# Patient Record
Sex: Male | Born: 1970 | Race: Black or African American | Hispanic: No | Marital: Married | Smoking: Never smoker
Health system: Southern US, Community
[De-identification: ages and names within clinical notes are randomized; demographics above are authoritative.]

## PROBLEM LIST (undated history)

## (undated) HISTORY — PX: HAND SURGERY: SHX662

---

## 2000-08-31 ENCOUNTER — Emergency Department (HOSPITAL_COMMUNITY): Admission: EM | Admit: 2000-08-31 | Discharge: 2000-08-31 | Payer: Self-pay | Admitting: Internal Medicine

## 2000-08-31 ENCOUNTER — Encounter: Payer: Self-pay | Admitting: Internal Medicine

## 2009-01-05 ENCOUNTER — Observation Stay (HOSPITAL_COMMUNITY): Admission: EM | Admit: 2009-01-05 | Discharge: 2009-01-06 | Payer: Self-pay | Admitting: Emergency Medicine

## 2009-01-05 ENCOUNTER — Encounter (INDEPENDENT_AMBULATORY_CARE_PROVIDER_SITE_OTHER): Payer: Self-pay | Admitting: General Surgery

## 2010-06-19 IMAGING — CT CT ABDOMEN W/ CM
2 of 4 series · 17 of 46 positions shown, 19 images · IV contrast (APPLIED)
Comparison: None available.

CT ABDOMEN

CLINICAL DATA: Right lower quadrant abdominal pain times 3 days.
Vomiting.

CT ABDOMEN AND PELVIS WITH CONTRAST
TECHNIQUE: Multidetector CT imaging of the abdomen and pelvis was
performed using the standard protocol following bolus
administration of intravenous contrast.
Contrast: 100 ml Omnipaque 300

[Series 3: abd/pelv with 5.0 b31f st · axial · 0.64mm/px · z∈[+774,+1174]mm · 14 of 88 slices shown, 16 images]
[im 4/88  soft-tissue]
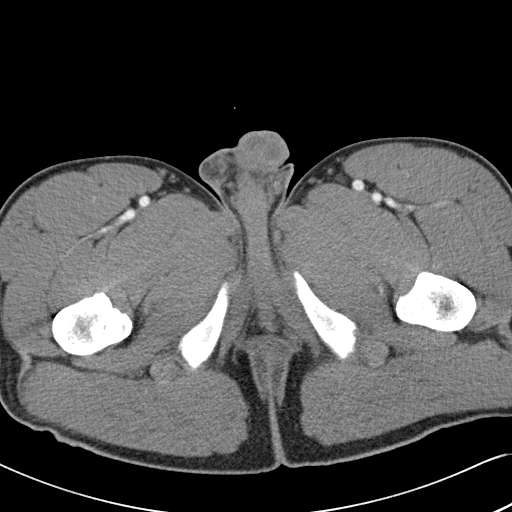
[im 4/88  bone]
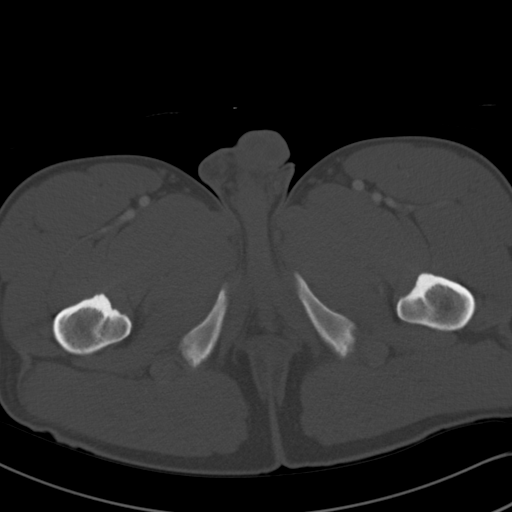
[im 12/88  soft-tissue]
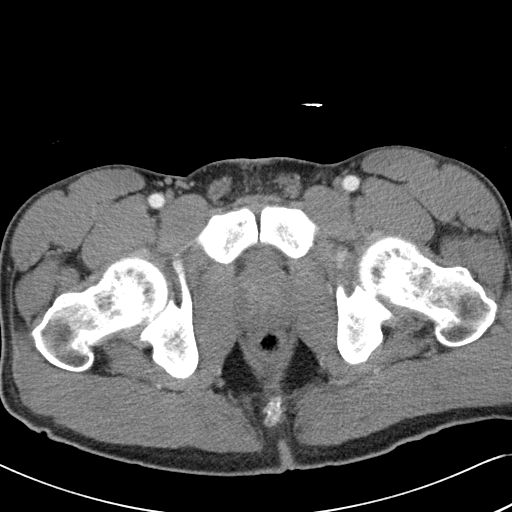
[im 16/88  soft-tissue]
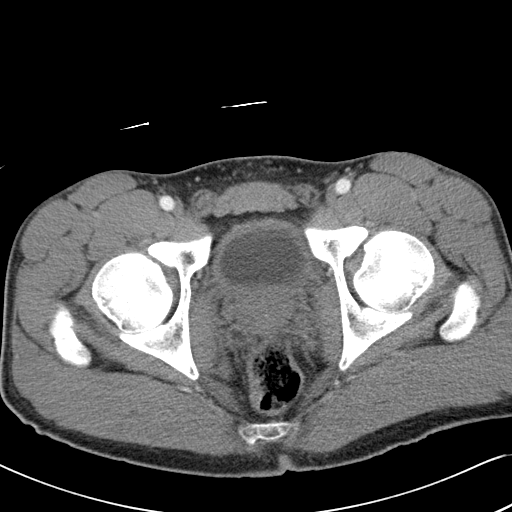
[im 24/88  soft-tissue]
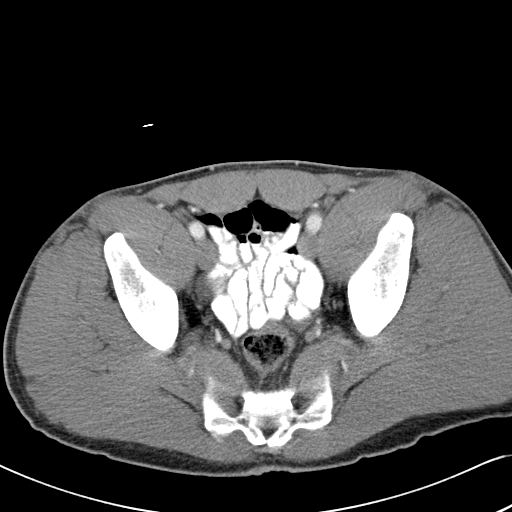
[im 28/88  soft-tissue]
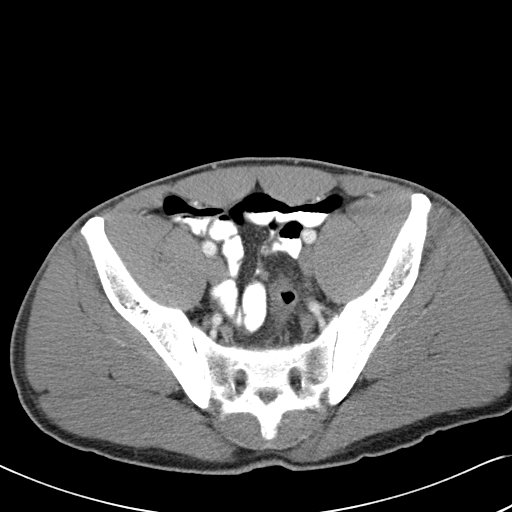
[im 36/88  soft-tissue]
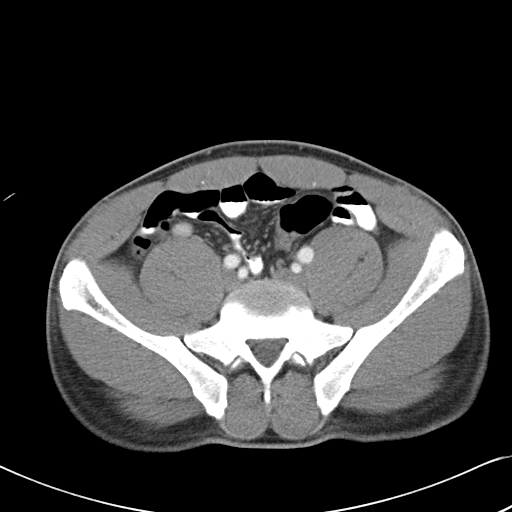
[im 40/88  soft-tissue]
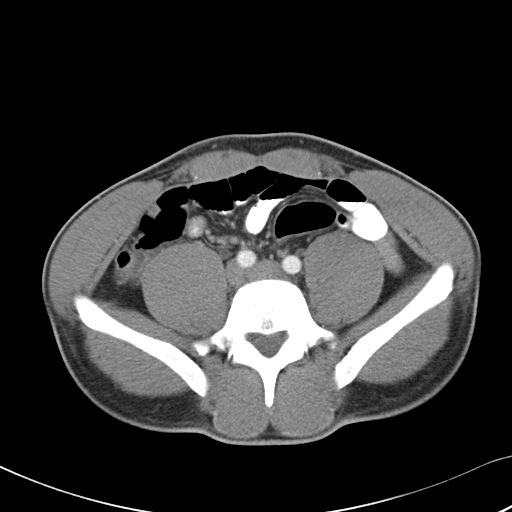
[im 48/88  soft-tissue]
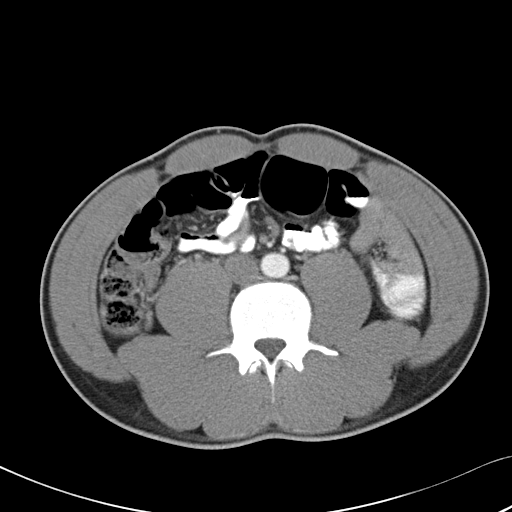
[im 52/88  soft-tissue]
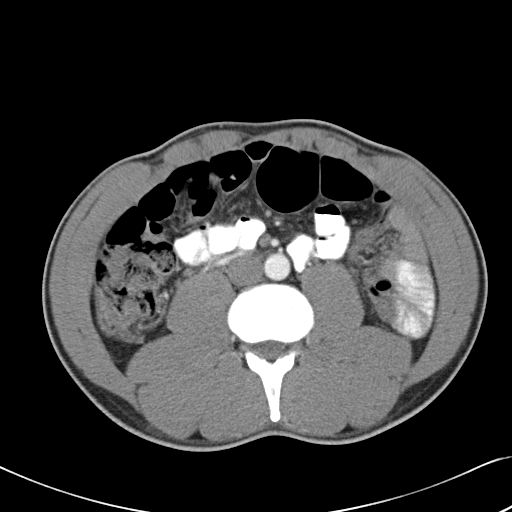
[im 52/88  bone]
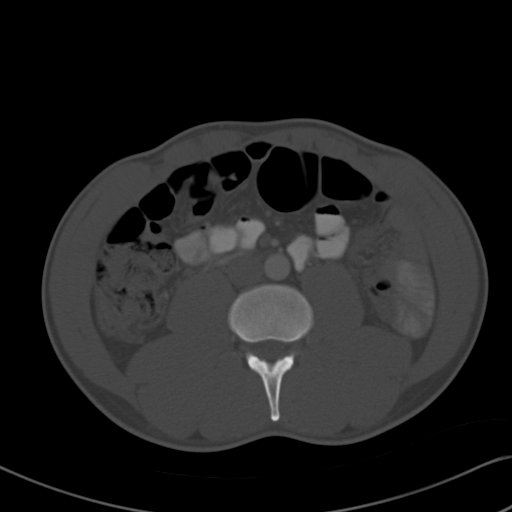
[im 60/88  soft-tissue]
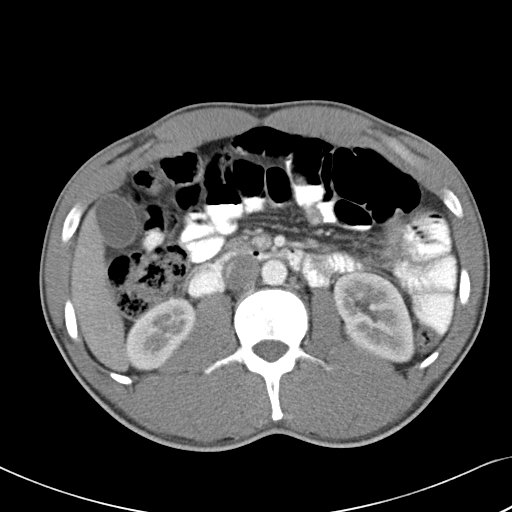
[im 64/88  soft-tissue]
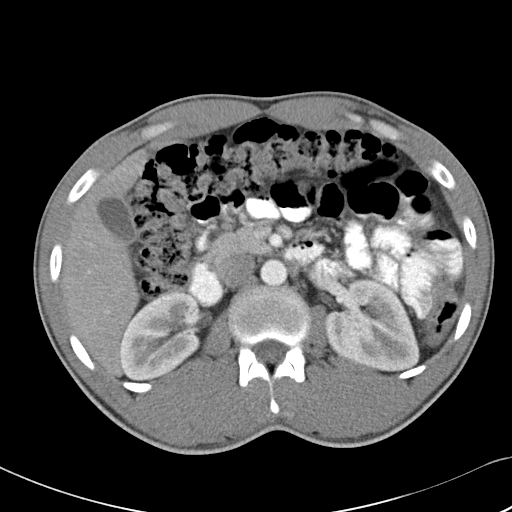
[im 72/88  soft-tissue]
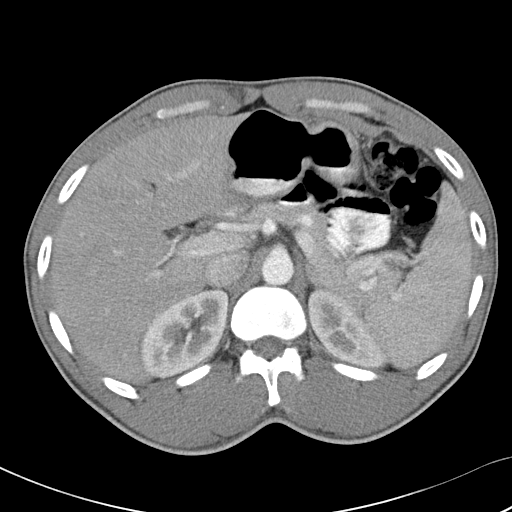
[im 76/88  soft-tissue]
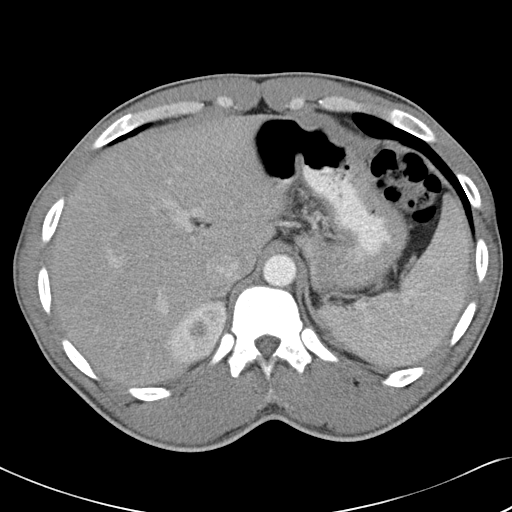
[im 84/88  soft-tissue]
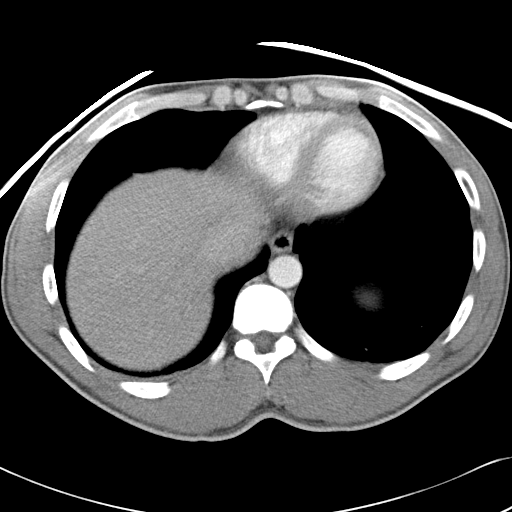

[Series 602: <mpr thick range> · coronal · 0.86mm/px · 3 of 69 slices shown]
[im 23/69  soft-tissue]
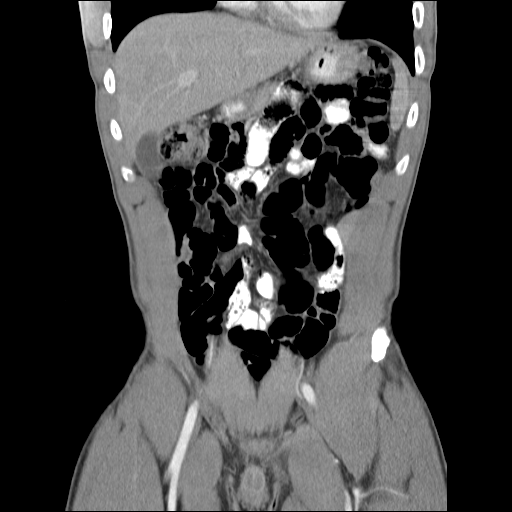
[im 31/69  soft-tissue]
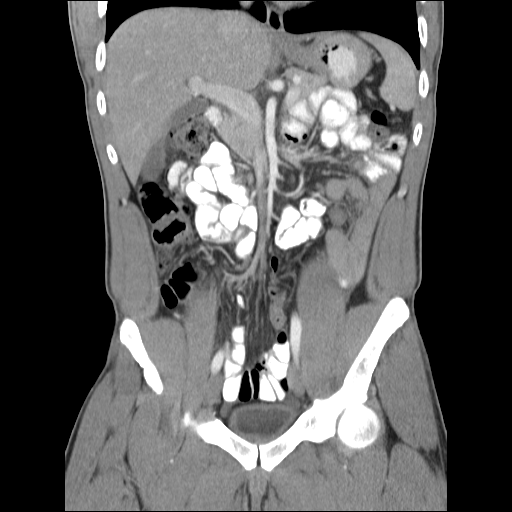
[im 38/69  soft-tissue]
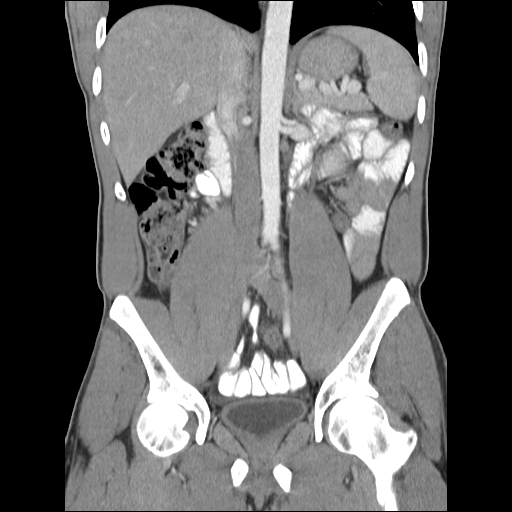

[17 of 46 positions shown; findings below may reference images not displayed]

FINDINGS: The lung bases are clear without focal nodule, mass, or
airspace disease.  The heart size is normal.  There is no
significant pleural or pericardial effusion.

The liver and spleen are within normal limits.  The stomach,
pancreas, common bile duct and gallbladder are normal.  The adrenal
glands and kidneys are within normal limits bilaterally.  There is
no significant abdominal lymphadenopathy or free fluid.  The bowel
is unremarkable.  Bone windows reveal no focal lytic or blastic
lesions.
IMPRESSION: 1.  No acute abnormality of the abdomen.

CT PELVIS
FINDINGS: The rectosigmoid colon is within normal limits.  The
remainder the colon is unremarkable.  The appendix is mildly
enlarged at its base, measuring 8 mm.  There is slight stranding of
the fat surrounding the appendix.  There is minimal free fluid
layering in the anatomic pelvis.  Urinary bladder is partially
collapsed.  Bone windows are unremarkable.
IMPRESSION: 1.  Mild enlargement and fat stranding about the appendix
suggesting early appendicitis.
2.  Minimal free fluid layering in the pelvis compatible with an
inflammatory process.

## 2011-01-07 LAB — URINALYSIS, ROUTINE W REFLEX MICROSCOPIC
Bilirubin Urine: NEGATIVE
Glucose, UA: NEGATIVE mg/dL
Hgb urine dipstick: NEGATIVE
Ketones, ur: NEGATIVE mg/dL
pH: 6 (ref 5.0–8.0)

## 2011-01-07 LAB — COMPREHENSIVE METABOLIC PANEL
ALT: 18 U/L (ref 0–53)
AST: 22 U/L (ref 0–37)
Albumin: 4.5 g/dL (ref 3.5–5.2)
Alkaline Phosphatase: 69 U/L (ref 39–117)
BUN: 9 mg/dL (ref 6–23)
CO2: 28 mEq/L (ref 19–32)
Calcium: 9.4 mg/dL (ref 8.4–10.5)
Chloride: 97 mEq/L (ref 96–112)
Creatinine, Ser: 1.07 mg/dL (ref 0.4–1.5)
GFR calc Af Amer: >60 mL/min (ref >60)
GFR calc non Af Amer: >60 mL/min (ref >60)
Glucose, Bld: 91 mg/dL (ref 70–99)
Potassium: 3.4 mEq/L — ABNORMAL LOW (ref 3.5–5.1)
Sodium: 138 mEq/L (ref 135–145)
Total Bilirubin: 1.5 mg/dL — ABNORMAL HIGH (ref 0.3–1.2)
Total Protein: 7.5 g/dL (ref 6.0–8.3)

## 2011-01-07 LAB — DIFFERENTIAL
Basophils Absolute: 0 10*3/uL (ref 0.0–0.1)
Basophils Relative: 1 % (ref 0–1)
Eosinophils Absolute: 0.1 10*3/uL (ref 0.0–0.7)
Eosinophils Relative: 2 % (ref 0–5)
Lymphocytes Relative: 23 % (ref 12–46)
Lymphs Abs: 1.4 10*3/uL (ref 0.7–4.0)
Monocytes Absolute: 0.4 10*3/uL (ref 0.1–1.0)
Monocytes Relative: 6 % (ref 3–12)
Neutro Abs: 4.1 10*3/uL (ref 1.7–7.7)
Neutrophils Relative %: 69 % (ref 43–77)

## 2011-01-07 LAB — CBC
HCT: 47.8 % (ref 39.0–52.0)
Hemoglobin: 16 g/dL (ref 13.0–17.0)
MCHC: 33.5 g/dL (ref 30.0–36.0)
MCV: 84.8 fL (ref 78.0–100.0)
Platelets: 169 10*3/uL (ref 150–400)
RBC: 5.63 MIL/uL (ref 4.22–5.81)
RDW: 13 % (ref 11.5–15.5)
WBC: 6 10*3/uL (ref 4.0–10.5)

## 2011-01-07 LAB — URINE MICROSCOPIC-ADD ON

## 2011-02-10 NOTE — Op Note (Signed)
[name], [name]               ACCOUNT NO.:  [id number]   MEDICAL RECORD NO.:  [id number]          PATIENT TYPE:  INP   LOCATION:  5125                         FACILITY:  [facility]   PHYSICIAN:  Gabrielle Dare. Janee Morn, M.D.DATE OF BIRTH:  Aug 24, 1971   DATE OF PROCEDURE:  01/05/2009  DATE OF DISCHARGE:                               OPERATIVE REPORT   PREOPERATIVE DIAGNOSIS:  Acute appendicitis.   POSTOPERATIVE DIAGNOSIS:  Acute appendicitis.   PROCEDURE:  Laparoscopic appendectomy.   SURGEON:  Gabrielle Dare. Janee Morn, MD   ANESTHESIA:  General endotracheal.   HISTORY OF PRESENT ILLNESS:  [name] [name] is a 40 year old gentleman who  was originally from [place] in [place], who came to the [facility]  Emergency Department earlier tonight with a 2-day history of right lower  quadrant abdominal pain.  Workup including CT scan of the abdomen and  pelvis was consistent with acute appendicitis.  He was brought  emergently to the operating room.   PROCEDURE IN DETAIL:  Informed consent was obtained.  The patient  received intravenous antibiotics.  He was brought to the operating room  after identifying him in the preop holding area.  He received general  endotracheal anesthesia by the anesthesia staff.  His abdomen was  prepped and draped in sterile fashion after a Foley catheter was placed  by the nursing staff.  Time-out procedure was done.  Infraumbilical area  was infiltrated with 0.25% Marcaine with epinephrine.  Infraumbilical  incision was made.  Subcutaneous tissues were dissected down revealing  the anterior fascia.  This was divided sharply along the midline.  The  peritoneal cavity was entered under direct vision without difficulty.  A  0-Vicryl purse-string suture was placed around the fascial opening and  the Hasson trocar was inserted into the abdomen.  The abdomen was  insufflated with carbon dioxide in a standard fashion.  Under direct  vision, a 12-mm left lower quadrant port and  a 5-mm right upper quadrant  port were placed.  Marcaine 0.25% with epinephrine was used at all port  sites.  Laparoscopic exploration revealed a very inflamed, but not  ruptured appendix and a very small amount of pelvic free fluid that was  clear.  The appendix was retracted towards the anterior abdominal wall.  The mesoappendix was then divided gradually with the harmonic scalpel  achieving excellent hemostasis.  Once this totally freed up the base,  the base was divided with a laparoscopic GIA stapler with vascular load  achieving an excellent closure.  The appendix was placed in an EndoCatch  bag and removed from the left lower quadrant port site.  The abdomen was  then copiously irrigated with 2 L of warm saline.  The irrigation fluid  returned clear.  The staple line on the cecum remained intact.  There  was no bleeding from the mesoappendix.  Irrigation fluid returned clear  and the remainder of that evacuated out.  The staple line was rechecked  again and remained intact.  Ports were then removed under direct vision  and pneumoperitoneum was released.  The Hasson trocar was removed.  All  three wounds were copiously irrigated.  The skin of each was then closed  with a running 4-0 Vicryl subcuticular  stitch followed by Dermabond.  Sponge, needle, and instrument counts  were all correct.  There was excellent hemostasis at the end of the  case.  The patient tolerated the procedure well without apparent  complication and was taken to the recovery room in stable condition.      Gabrielle Dare Janee Morn, M.D.  Electronically Signed     BET/MEDQ  D:  01/05/2009  T:  01/06/2009  Job:  [id number]
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
## 2012-03-01 ENCOUNTER — Encounter (HOSPITAL_COMMUNITY): Payer: Self-pay | Admitting: Emergency Medicine

## 2012-03-01 ENCOUNTER — Emergency Department (HOSPITAL_COMMUNITY)
Admission: EM | Admit: 2012-03-01 | Discharge: 2012-03-01 | Disposition: A | Discharge status: Home / Self Care | Payer: PRIVATE HEALTH INSURANCE | Source: Home / Self Care | Attending: Emergency Medicine | Admitting: Emergency Medicine

## 2012-03-01 DIAGNOSIS — H11439 Conjunctival hyperemia, unspecified eye: Secondary | ICD-10-CM

## 2012-03-01 DIAGNOSIS — H11432 Conjunctival hyperemia, left eye: Secondary | ICD-10-CM

## 2012-03-01 MED ORDER — TOBRAMYCIN 0.3 % OP OINT: TOPICAL_OINTMENT | Freq: Three times a day (TID) | OPHTHALMIC | Status: AC

## 2012-03-01 MED ORDER — TETRACAINE HCL 0.5 % OP SOLN
OPHTHALMIC | Status: AC
  Filled 2012-03-01: qty 2

## 2012-03-01 NOTE — ED Notes (Signed)
Eye box in treatment room 

## 2012-03-01 NOTE — ED Notes (Addendum)
Left eye is itchy, watery, painful.  Onset 3 days ago, no known injury.  Denies vision changes.  Does not use eye glasses or contacts.

## 2012-03-01 NOTE — Discharge Instructions (Signed)
As discussed, discomfort and tearing should be better in 48 hours episode of the antibiotic. If no improvement should follow-up with the eye doctor. No foreign body were seen in your eye.

## 2012-03-01 NOTE — ED Provider Notes (Signed)
History     CSN: [id number]  Arrival date & time 03/01/12  1328   First MD Initiated Contact with Patient 03/01/12 1349      Chief Complaint  Patient presents with  . Eye Pain    (Consider location/radiation/quality/duration/timing/severity/associated sxs/prior treatment) HPI Comments: Like itching and tearing, feels like something is in my upper eyelid but I did not see anything. Been tearing and itching in my left eye. Patient denies any injury or foreign body. Denies any headache, visual changes or nausea. No further symptoms. Patient denies any other symptoms such as sneezing, runny nose or cough  Patient is a 41 y.o. male presenting with eye pain. The history is provided by the patient.  Eye Pain This is a new problem. The current episode started more than 2 days ago. The problem occurs constantly. The problem has not changed since onset.Pertinent negatives include no headaches.    History reviewed. No pertinent past medical history.  History reviewed. No pertinent past surgical history.  No family history on file.  History  Substance Use Topics  . Smoking status: Never Smoker   . Smokeless tobacco: Not on file  . Alcohol Use: No      Review of Systems  Constitutional: Negative for activity change and appetite change.  Eyes: Positive for pain and itching. Negative for photophobia, redness and visual disturbance.  Gastrointestinal: Negative for nausea and vomiting.  Skin: Negative for rash.  Neurological: Negative for dizziness, facial asymmetry and headaches.    Allergies  Review of patient's allergies indicates no known allergies.  Home Medications   Current Outpatient Rx  Name Route Sig Dispense Refill  . NAPHAZOLINE-PHENIRAMINE 0.027-0.315 % OP SOLN Ophthalmic Apply to eye.      BP 116/72  Pulse 66  Temp(Src) 98 F (36.7 C) (Oral)  Resp 16  SpO2 100%  Physical Exam  Nursing note and vitals reviewed. Constitutional: He appears well-developed and  well-nourished. No distress.  Eyes: Conjunctivae, EOM and lids are normal. Pupils are equal, round, and reactive to light. No foreign bodies found. Right eye exhibits no chemosis and no discharge. Left eye exhibits no chemosis, no discharge and no exudate. No foreign body present in the left eye. No scleral icterus. Left eye exhibits normal extraocular motion.  Slit lamp exam:      The left eye shows corneal abrasion and fluorescein uptake. The left eye shows no corneal flare, no corneal ulcer, no foreign body, no hyphema and no hypopyon.      ED Course  Procedures (including critical care time)  Labs Reviewed - No data to display No results found.   1. Conjunctival hyperemia of left eye       MDM  Patient denies any eye injury- focal conjunctival uptake. Have advised patient to followup with the eye doctor and to discontinue eyedrops as he was previously using. Patient does not seem to be having any other allergies she type symptoms. Suspect maybe he might have had a unsuspected injury to his left eye     Jimmie Molly, MD 03/01/12 [phone]

## 2014-02-25 ENCOUNTER — Encounter (HOSPITAL_COMMUNITY): Payer: Self-pay | Admitting: Emergency Medicine

## 2014-02-25 ENCOUNTER — Emergency Department (INDEPENDENT_AMBULATORY_CARE_PROVIDER_SITE_OTHER): Payer: 59

## 2014-02-25 ENCOUNTER — Emergency Department (INDEPENDENT_AMBULATORY_CARE_PROVIDER_SITE_OTHER)
Admission: EM | Admit: 2014-02-25 | Discharge: 2014-02-25 | Disposition: A | Discharge status: Home / Self Care | Payer: 59 | Source: Home / Self Care | Attending: Family Medicine | Admitting: Family Medicine

## 2014-02-25 DIAGNOSIS — S93409A Sprain of unspecified ligament of unspecified ankle, initial encounter: Secondary | ICD-10-CM

## 2014-02-25 DIAGNOSIS — Y939 Activity, unspecified: Secondary | ICD-10-CM

## 2014-02-25 DIAGNOSIS — X500XXA Overexertion from strenuous movement or load, initial encounter: Secondary | ICD-10-CM

## 2014-02-25 MED ORDER — DICLOFENAC SODIUM 50 MG PO TBEC: 50.0000 mg | DELAYED_RELEASE_TABLET | Freq: Two times a day (BID) | ORAL | Status: DC | PRN

## 2014-02-25 NOTE — Discharge Instructions (Signed)
Thank you for coming in today. Take the diclofenac for pain as needed.  Come back as needed.   Acute Ankle Sprain with Phase I Rehab An acute ankle sprain is a partial or complete tear in one or more of the ligaments of the ankle due to traumatic injury. The severity of the injury depends on both the the number of ligaments sprained and the grade of sprain. There are 3 grades of sprains.   A grade 1 sprain is a mild sprain. There is a slight pull without obvious tearing. There is no loss of strength, and the muscle and ligament are the correct length.  A grade 2 sprain is a moderate sprain. There is tearing of fibers within the substance of the ligament where it connects two bones or two cartilages. The length of the ligament is increased, and there is usually decreased strength.  A grade 3 sprain is a complete rupture of the ligament and is uncommon. In addition to the grade of sprain, there are three types of ankle sprains.  Lateral ankle sprains: This is a sprain of one or more of the three ligaments on the outer side (lateral) of the ankle. These are the most common sprains. Medial ankle sprains: There is one large triangular ligament of the inner side (medial) of the ankle that is susceptible to injury. Medial ankle sprains are less common. Syndesmosis, "high ankle," sprains: The syndesmosis is the ligament that connects the two bones of the lower leg. Syndesmosis sprains usually only occur with very severe ankle sprains. SYMPTOMS  Pain, tenderness, and swelling in the ankle, starting at the side of injury that may progress to the whole ankle and foot with time.  "Pop" or tearing sensation at the time of injury.  Bruising that may spread to the heel.  Impaired ability to walk soon after injury. CAUSES   Acute ankle sprains are caused by trauma placed on the ankle that temporarily forces or pries the anklebone (talus) out of its normal socket.  Stretching or tearing of the ligaments  that normally hold the joint in place (usually due to a twisting injury). RISK INCREASES WITH:  Previous ankle sprain.  Sports in which the foot may land awkwardly (ie. basketball, volleyball, or soccer) or walking or running on uneven or rough surfaces.  Shoes with inadequate support to prevent sideways motion when stress occurs.  Poor strength and flexibility.  Poor balance skills.  Contact sports. PREVENTION   Warm up and stretch properly before activity.  Maintain physical fitness:  Ankle and leg flexibility, muscle strength, and endurance.  Cardiovascular fitness.  Balance training activities.  Use proper technique and have a coach correct improper technique.  Taping, protective strapping, bracing, or high-top tennis shoes may help prevent injury. Initially, tape is best; however, it loses most of its support function within 10 to 15 minutes.  Wear proper fitted protective shoes (High-top shoes with taping or bracing is more effective than either alone).  Provide the ankle with support during sports and practice activities for 12 months following injury. PROGNOSIS   If treated properly, ankle sprains can be expected to recover completely; however, the length of recovery depends on the degree of injury.  A grade 1 sprain usually heals enough in 5 to 7 days to allow modified activity and requires an average of 6 weeks to heal completely.  A grade 2 sprain requires 6 to 10 weeks to heal completely.  A grade 3 sprain requires 12 to 16 weeks to  heal.  A syndesmosis sprain often takes more than 3 months to heal. RELATED COMPLICATIONS   Frequent recurrence of symptoms may result in a chronic problem. Appropriately addressing the problem the first time decreases the frequency of recurrence and optimizes healing time. Severity of the initial sprain does not predict the likelihood of later instability.  Injury to other structures (bone, cartilage, or tendon).  A  chronically unstable or arthritic ankle joint is a possiblity with repeated sprains. TREATMENT Treatment initially involves the use of ice, medication, and compression bandages to help reduce pain and inflammation. Ankle sprains are usually immobilized in a walking cast or boot to allow for healing. Crutches may be recommended to reduce pressure on the injury. After immobilization, strengthening and stretching exercises may be necessary to regain strength and a full range of motion. Surgery is rarely needed to treat ankle sprains. MEDICATION   Nonsteroidal anti-inflammatory medications, such as aspirin and ibuprofen (do not take for the first 3 days after injury or within 7 days before surgery), or other minor pain relievers, such as acetaminophen, are often recommended. Take these as directed by your caregiver. Contact your caregiver immediately if any bleeding, stomach upset, or signs of an allergic reaction occur from these medications.  Ointments applied to the skin may be helpful.  Pain relievers may be prescribed as necessary by your caregiver. Do not take prescription pain medication for longer than 4 to 7 days. Use only as directed and only as much as you need. HEAT AND COLD  Cold treatment (icing) is used to relieve pain and reduce inflammation for acute and chronic cases. Cold should be applied for 10 to 15 minutes every 2 to 3 hours for inflammation and pain and immediately after any activity that aggravates your symptoms. Use ice packs or an ice massage.  Heat treatment may be used before performing stretching and strengthening activities prescribed by your caregiver. Use a heat pack or a warm soak. SEEK IMMEDIATE MEDICAL CARE IF:   Pain, swelling, or bruising worsens despite treatment.  You experience pain, numbness, discoloration, or coldness in the foot or toes.  New, unexplained symptoms develop (drugs used in treatment may produce side effects.) EXERCISES  PHASE I  EXERCISES RANGE OF MOTION (ROM) AND STRETCHING EXERCISES - Ankle Sprain, Acute Phase I, Weeks 1 to 2 These exercises may help you when beginning to restore flexibility in your ankle. You will likely work on these exercises for the 1 to 2 weeks after your injury. Once your physician, physical therapist, or athletic trainer sees adequate progress, he or she will advance your exercises. While completing these exercises, remember:   Restoring tissue flexibility helps normal motion to return to the joints. This allows healthier, less painful movement and activity.  An effective stretch should be held for at least 30 seconds.  A stretch should never be painful. You should only feel a gentle lengthening or release in the stretched tissue. RANGE OF MOTION - Dorsi/Plantar Flexion  While sitting with your right / left knee straight, draw the top of your foot upwards by flexing your ankle. Then reverse the motion, pointing your toes downward.  Hold each position for __________ seconds.  After completing your first set of exercises, repeat this exercise with your knee bent. Repeat __________ times. Complete this exercise __________ times per day.  RANGE OF MOTION - Ankle Alphabet  Imagine your right / left big toe is a pen.  Keeping your hip and knee still, write out the entire alphabet  with your "pen." Make the letters as large as you can without increasing any discomfort. Repeat __________ times. Complete this exercise __________ times per day.  STRENGTHENING EXERCISES - Ankle Sprain, Acute -Phase I, Weeks 1 to 2 These exercises may help you when beginning to restore strength in your ankle. You will likely work on these exercises for 1 to 2 weeks after your injury. Once your physician, physical therapist, or athletic trainer sees adequate progress, he or she will advance your exercises. While completing these exercises, remember:   Muscles can gain both the endurance and the strength needed for  everyday activities through controlled exercises.  Complete these exercises as instructed by your physician, physical therapist, or athletic trainer. Progress the resistance and repetitions only as guided.  You may experience muscle soreness or fatigue, but the pain or discomfort you are trying to eliminate should never worsen during these exercises. If this pain does worsen, stop and make certain you are following the directions exactly. If the pain is still present after adjustments, discontinue the exercise until you can discuss the trouble with your clinician. STRENGTH - Dorsiflexors  Secure a rubber exercise band/tubing to a fixed object (ie. table, pole) and loop the other end around your right / left foot.  Sit on the floor facing the fixed object. The band/tubing should be slightly tense when your foot is relaxed.  Slowly draw your foot back toward you using your ankle and toes.  Hold this position for __________ seconds. Slowly release the tension in the band and return your foot to the starting position. Repeat __________ times. Complete this exercise __________ times per day.  STRENGTH - Plantar-flexors   Sit with your right / left leg extended. Holding onto both ends of a rubber exercise band/tubing, loop it around the ball of your foot. Keep a slight tension in the band.  Slowly push your toes away from you, pointing them downward.  Hold this position for __________ seconds. Return slowly, controlling the tension in the band/tubing. Repeat __________ times. Complete this exercise __________ times per day.  STRENGTH - Ankle Eversion  Secure one end of a rubber exercise band/tubing to a fixed object (table, pole). Loop the other end around your foot just before your toes.  Place your fists between your knees. This will focus your strengthening at your ankle.  Drawing the band/tubing across your opposite foot, slowly, pull your little toe out and up. Make sure the band/tubing  is positioned to resist the entire motion.  Hold this position for __________ seconds. Have your muscles resist the band/tubing as it slowly pulls your foot back to the starting position.  Repeat __________ times. Complete this exercise __________ times per day.  STRENGTH - Ankle Inversion  Secure one end of a rubber exercise band/tubing to a fixed object (table, pole). Loop the other end around your foot just before your toes.  Place your fists between your knees. This will focus your strengthening at your ankle.  Slowly, pull your big toe up and in, making sure the band/tubing is positioned to resist the entire motion.  Hold this position for __________ seconds.  Have your muscles resist the band/tubing as it slowly pulls your foot back to the starting position. Repeat __________ times. Complete this exercises __________ times per day.  STRENGTH - Towel Curls  Sit in a chair positioned on a non-carpeted surface.  Place your right / left foot on a towel, keeping your heel on the floor.  Pull the towel  toward your heel by only curling your toes. Keep your heel on the floor.  If instructed by your physician, physical therapist, or athletic trainer, add weight to the end of the towel. Repeat __________ times. Complete this exercise __________ times per day. Document Released: 04/15/2005 Document Revised: 12/07/2011 Document Reviewed: 12/27/2008 [facility]Exit[facility] Patient Information 2014 [place], [place].

## 2014-02-25 NOTE — ED Notes (Signed)
C/o left ankle pain  States he was playing sports when he twisted ankle and left knee The ankle is swollen  Did ice, use pain meds and massage ankle as tx

## 2014-02-25 NOTE — ED Provider Notes (Signed)
Devonne Dantin is a 43 y.o. male who presents to Urgent Care today for left lateral ankle pain occurring about one week ago. Patient suffered an inversion injury. Has pain and swelling laterally. The pain is worse activity better with rest. He has tried rest ice elevation massage and NSAIDs which has not helped. He feels well otherwise.   History reviewed. No pertinent past medical history. History  Substance Use Topics  . Smoking status: Never Smoker   . Smokeless tobacco: Not on file  . Alcohol Use: No   ROS as above Medications: No current facility-administered medications for this encounter.   Current Outpatient Prescriptions  Medication Sig Dispense Refill  . diclofenac (VOLTAREN) 50 MG EC tablet Take 1 tablet (50 mg total) by mouth 2 (two) times daily as needed.  60 tablet  0    Exam:  BP 142/78  Pulse 70  Temp(Src) 98.4 F (36.9 C) (Oral)  Resp 18  SpO2 100% Gen: Well NAD Left ankle: Swollen and mildly tender over the lateral malleolus. Stable ligamentous exam. Normal ankle motion. Capillary Refill pulses and sensation are intact distally.  No results found for this or any previous visit (from the past 24 hour(s)). Dg Ankle Complete Left  02/25/2014   CLINICAL DATA:  Ankle pain/swelling  EXAM: LEFT ANKLE COMPLETE - 3+ VIEW  COMPARISON:  None.  FINDINGS: No fracture or dislocation is seen.  The ankle mortise is intact.  The base of the fifth metatarsal is unremarkable.  Mild diffuse soft tissue swelling along the ankle.  IMPRESSION: No fracture or dislocation is seen.   Electronically Signed   By: Charline Bills M.D.   On: 02/25/2014 11:13    Assessment and Plan: 43 y.o. male with left ankle sprain. Plan for ASO, diclofenac, and home exercise program.  Discussed warning signs or symptoms. Please see discharge instructions. Patient expresses understanding.    Rodolph Bong, MD 02/25/14 [phone]

## 2014-04-23 ENCOUNTER — Emergency Department (INDEPENDENT_AMBULATORY_CARE_PROVIDER_SITE_OTHER)
Admission: EM | Admit: 2014-04-23 | Discharge: 2014-04-23 | Disposition: A | Discharge status: Home / Self Care | Payer: 59 | Source: Home / Self Care | Attending: Family Medicine | Admitting: Family Medicine

## 2014-04-23 ENCOUNTER — Encounter (HOSPITAL_COMMUNITY): Payer: Self-pay | Admitting: Emergency Medicine

## 2014-04-23 ENCOUNTER — Other Ambulatory Visit (HOSPITAL_COMMUNITY)
Admission: RE | Admit: 2014-04-23 | Discharge: 2014-04-23 | Disposition: A | Discharge status: Home / Self Care | Payer: 59 | Source: Ambulatory Visit | Attending: Family Medicine | Admitting: Family Medicine

## 2014-04-23 DIAGNOSIS — N390 Urinary tract infection, site not specified: Secondary | ICD-10-CM

## 2014-04-23 DIAGNOSIS — Z113 Encounter for screening for infections with a predominantly sexual mode of transmission: Secondary | ICD-10-CM | POA: Diagnosis not present

## 2014-04-23 LAB — POCT I-STAT, CHEM 8
BUN: 10 mg/dL (ref 6–23)
CHLORIDE: 106 meq/L (ref 96–112)
CREATININE: 1 mg/dL (ref 0.50–1.35)
Calcium, Ion: 1.17 mmol/L (ref 1.12–1.23)
Glucose, Bld: 94 mg/dL (ref 70–99)
HEMATOCRIT: 41 % (ref 39.0–52.0)
HEMOGLOBIN: 13.9 g/dL (ref 13.0–17.0)
POTASSIUM: 3.3 meq/L — AB (ref 3.7–5.3)
SODIUM: 139 meq/L (ref 137–147)
TCO2: 24 mmol/L (ref 0–100)

## 2014-04-23 LAB — POCT URINALYSIS DIP (DEVICE)
Glucose, UA: 100 mg/dL — AB
KETONES UR: 15 mg/dL — AB
NITRITE: NEGATIVE
PH: 7.5 (ref 5.0–8.0)
Specific Gravity, Urine: 1.025 (ref 1.005–1.030)
UROBILINOGEN UA: 2 mg/dL — AB (ref 0.0–1.0)

## 2014-04-23 MED ORDER — DOXYCYCLINE HYCLATE 100 MG PO CAPS: 100.0000 mg | ORAL_CAPSULE | Freq: Two times a day (BID) | ORAL | Status: AC

## 2014-04-23 MED ORDER — AZITHROMYCIN 250 MG PO TABS
1000.0000 mg | ORAL_TABLET | Freq: Once | ORAL | Status: AC
  Administered 2014-04-23: 1000 mg via ORAL

## 2014-04-23 MED ORDER — LIDOCAINE HCL (PF) 1 % IJ SOLN
INTRAMUSCULAR | Status: AC
  Filled 2014-04-23: qty 5

## 2014-04-23 MED ORDER — CEFTRIAXONE SODIUM 250 MG IJ SOLR
INTRAMUSCULAR | Status: AC
  Filled 2014-04-23: qty 250

## 2014-04-23 MED ORDER — AZITHROMYCIN 250 MG PO TABS
ORAL_TABLET | ORAL | Status: AC
  Filled 2014-04-23: qty 4

## 2014-04-23 MED ORDER — CEFTRIAXONE SODIUM 250 MG IJ SOLR
250.0000 mg | Freq: Once | INTRAMUSCULAR | Status: AC
  Administered 2014-04-23: 250 mg via INTRAMUSCULAR

## 2014-04-23 NOTE — ED Notes (Signed)
C/o pain w urination and decreased UA flow since yesterday

## 2014-04-23 NOTE — Discharge Instructions (Signed)

## 2014-04-23 NOTE — ED Notes (Signed)
Call back number for lab issues verified at release  

## 2014-04-23 NOTE — ED Provider Notes (Signed)
CSN: [id number]     Arrival date & time 04/23/14  1536 History   First MD Initiated Contact with Patient 04/23/14 1611     Chief Complaint  Patient presents with  . Dysuria   (Consider location/radiation/quality/duration/timing/severity/associated sxs/prior Treatment) HPI Comments: Denies genital lesions, known STI exposures, hematuria, fever, penile discharge, chills, N/V, abdominal pain or flank pain.  PCP: [facility]  Patient is a 43 y.o. male presenting with dysuria. The history is provided by the patient.  Dysuria This is a new problem. The current episode started 2 days ago. The problem occurs constantly. The problem has been gradually worsening.    History reviewed. No pertinent past medical history. History reviewed. No pertinent past surgical history. History reviewed. No pertinent family history. History  Substance Use Topics  . Smoking status: Never Smoker   . Smokeless tobacco: Not on file  . Alcohol Use: No    Review of Systems  Genitourinary: Positive for dysuria.  All other systems reviewed and are negative.   Allergies  Review of patient's allergies indicates no known allergies.  Home Medications   Prior to Admission medications   Medication Sig Start Date End Date Taking? Authorizing Provider  diclofenac (VOLTAREN) 50 MG EC tablet Take 1 tablet (50 mg total) by mouth 2 (two) times daily as needed. 02/25/14   Rodolph BongEvan S Corey, MD  doxycycline (VIBRAMYCIN) 100 MG capsule Take 1 capsule (100 mg total) by mouth 2 (two) times daily. X 7 days 04/23/14   Jess BartersJennifer Lee Anni Hocevar, PA   BP 144/76  Pulse 81  Temp(Src) 98.5 F (36.9 C) (Oral)  Resp 18  Ht 5\' 8"  (1.727 m)  Wt 205 lb (92.987 kg)  BMI 31.18 kg/m2  SpO2 97% Physical Exam  Nursing note and vitals reviewed. Constitutional: He is oriented to person, place, and time. He appears well-developed and well-nourished. No distress.  HENT:  Head: Normocephalic and atraumatic.  Eyes: Conjunctivae are  normal. No scleral icterus.  Cardiovascular: Normal rate, regular rhythm and normal heart sounds.   Pulmonary/Chest: Effort normal and breath sounds normal.  Abdominal: Soft. Bowel sounds are normal. He exhibits no distension. There is no tenderness. There is no CVA tenderness.  Genitourinary:  Patient declined  Musculoskeletal: Normal range of motion.  Neurological: He is alert and oriented to person, place, and time.  Skin: Skin is warm and dry.  Psychiatric: He has a normal mood and affect. His behavior is normal.    ED Course  Procedures (including critical care time) Labs Review Labs Reviewed  POCT URINALYSIS DIP (DEVICE) - Abnormal; Notable for the following:    Glucose, UA 100 (*)    Bilirubin Urine SMALL (*)    Ketones, ur 15 (*)    Hgb urine dipstick SMALL (*)    Protein, ur >=300 (*)    Urobilinogen, UA 2.0 (*)    Leukocytes, UA SMALL (*)    All other components within normal limits  POCT I-STAT, CHEM 8 - Abnormal; Notable for the following:    Potassium 3.3 (*)    All other components within normal limits  URINE CULTURE  URINE CYTOLOGY ANCILLARY ONLY    Imaging Review No results found.   MDM   1. UTI (lower urinary tract infection)    UA with +LE, glucose and proteinuria. Istat-8 grossly normal. Will initiate empiric treatment with azithromycin 1 gram po and ceftriaxone 250mg  [facility] and provide tx at home with 7day course of doxycycline. Urine sent for C&S  and cytology. Advised patient that if results indicated need for additional treatment, he would be notified by phone.    Jess Barters [place], [place] 04/23/14 1751

## 2014-04-23 NOTE — ED Provider Notes (Signed)
Medical screening examination/treatment/procedure(s) were performed by a resident physician or non-physician practitioner and as the supervising physician I was immediately available for consultation/collaboration.  Breaunna Gottlieb, MD Family Medicine   Layton Tappan J Jess Sulak, MD 04/23/14 2102 

## 2014-04-25 LAB — URINE CULTURE
Colony Count: NO GROWTH
Culture: NO GROWTH
Special Requests: NORMAL

## 2015-08-09 IMAGING — CR DG ANKLE COMPLETE 3+V*L*
3 series · 3 of 3 positions shown · non-contrast
Comparison: None.

CLINICAL DATA: Ankle pain/swelling

EXAM:
LEFT ANKLE COMPLETE - 3+ VIEW

[view not recorded (1 of 3)]
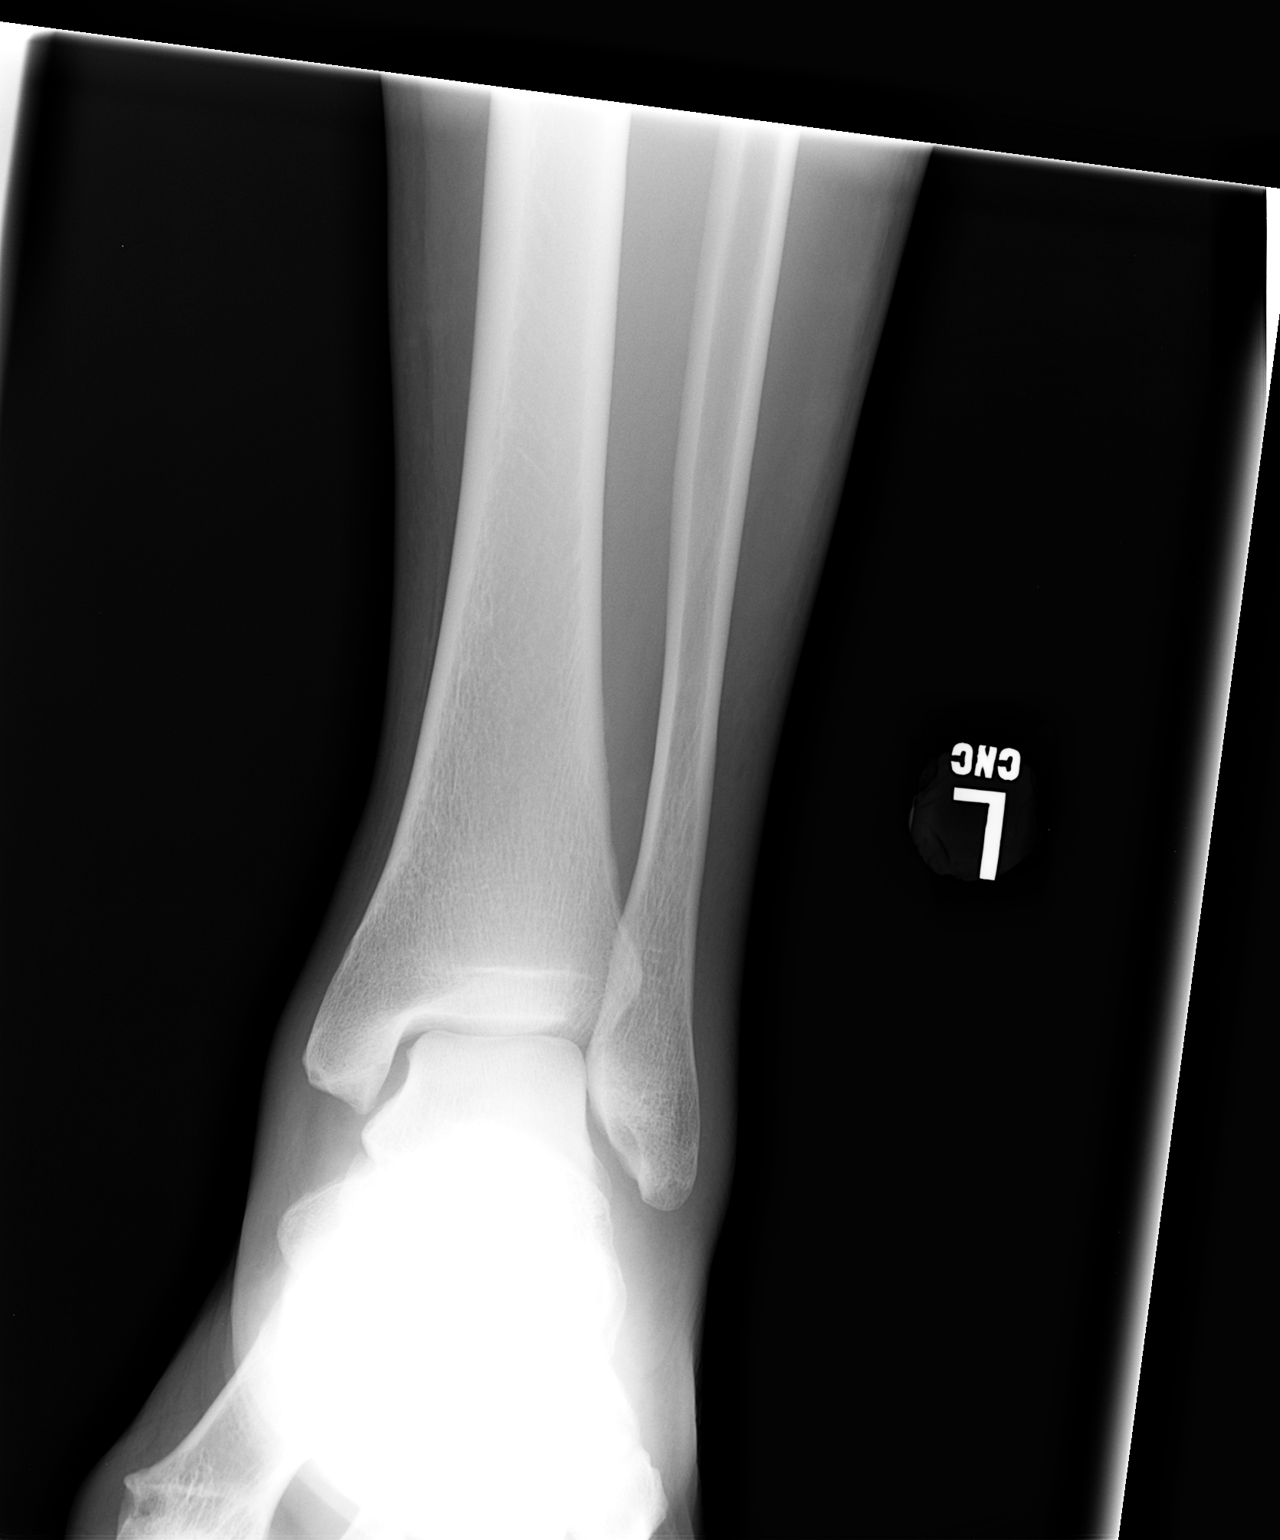

[view not recorded (2 of 3)]
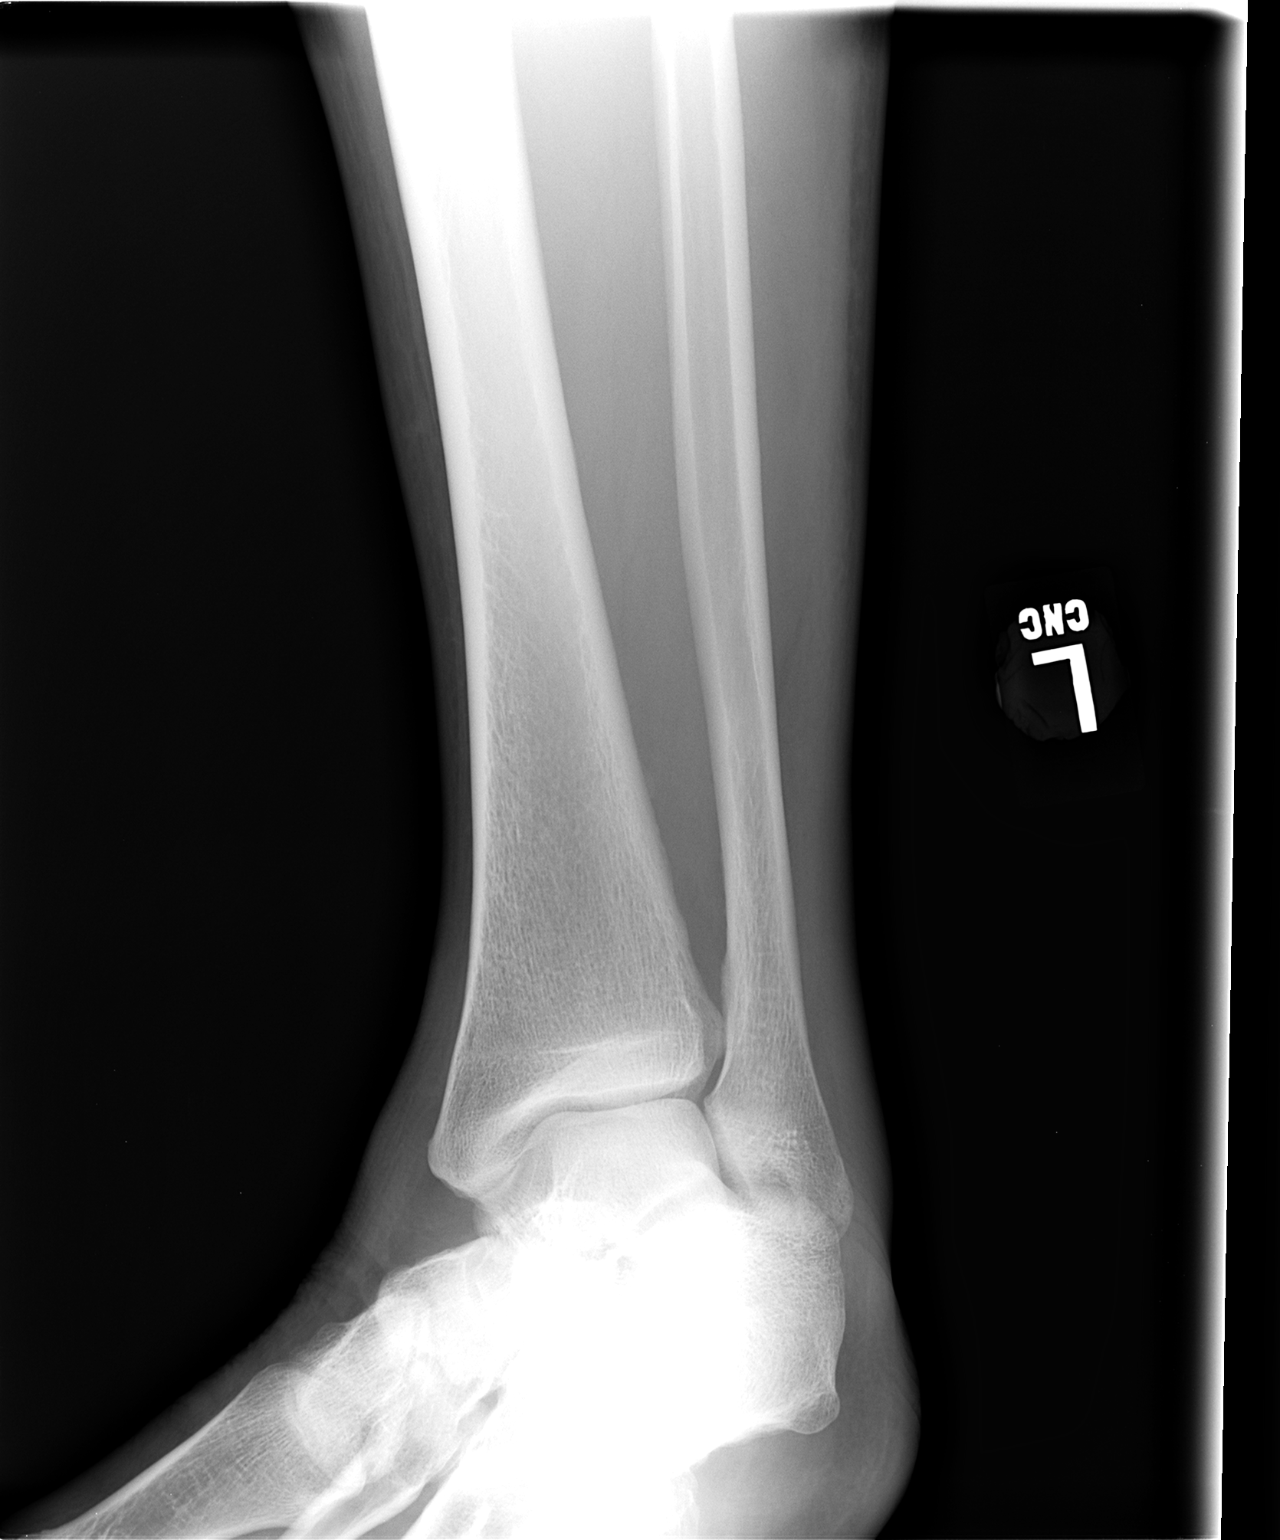

[view not recorded (3 of 3)]
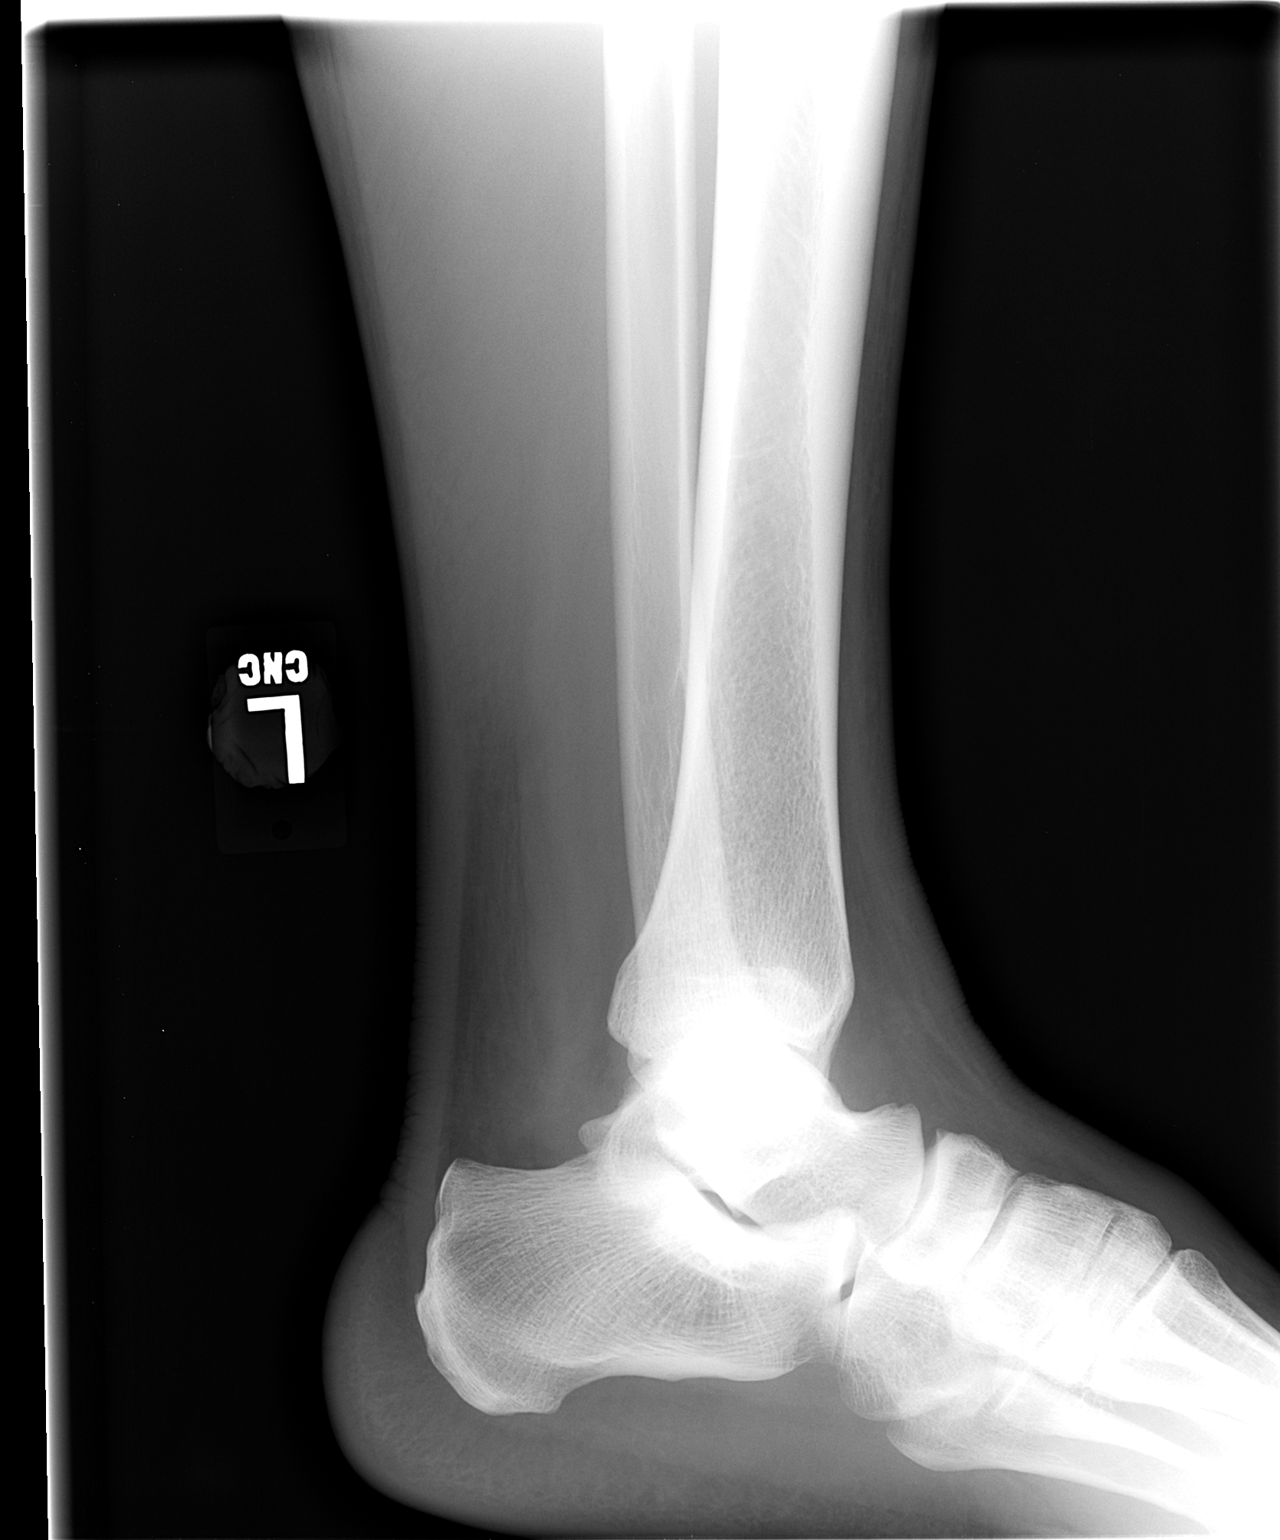

[3 of 3 positions shown; findings below may reference images not displayed]

FINDINGS: No fracture or dislocation is seen.

The ankle mortise is intact.

The base of the fifth metatarsal is unremarkable.

Mild diffuse soft tissue swelling along the ankle.
IMPRESSION: No fracture or dislocation is seen.

## 2017-06-21 ENCOUNTER — Encounter (HOSPITAL_COMMUNITY): Payer: Self-pay | Admitting: Emergency Medicine

## 2017-06-21 ENCOUNTER — Ambulatory Visit (HOSPITAL_COMMUNITY)
Admission: EM | Admit: 2017-06-21 | Discharge: 2017-06-21 | Disposition: A | Discharge status: Home / Self Care | Payer: BLUE CROSS/BLUE SHIELD | Attending: Family Medicine | Admitting: Family Medicine

## 2017-06-21 DIAGNOSIS — M436 Torticollis: Secondary | ICD-10-CM | POA: Diagnosis not present

## 2017-06-21 MED ORDER — DICLOFENAC SODIUM 50 MG PO TBEC: 50.0000 mg | DELAYED_RELEASE_TABLET | Freq: Two times a day (BID) | ORAL | 0 refills | Status: AC | PRN

## 2017-06-21 MED ORDER — DIPHENHYDRAMINE HCL 25 MG PO TABS: 25.0000 mg | ORAL_TABLET | Freq: Every evening | ORAL | 0 refills | Status: AC | PRN

## 2017-06-21 NOTE — ED Triage Notes (Signed)
Noticed neck pain on Saturday.  No specific injury.  Patient said he played soccer, then took a shower and that is when he noticed pain.  Patient plays soccer every weekend.  To turn his head right or left, he has pain in neck

## 2017-06-21 NOTE — ED Provider Notes (Signed)
  [facility]   [id number] 06/21/17 Arrival Time: 1011  ASSESSMENT & PLAN:  1. Torticollis, acute     Meds ordered this encounter  Medications  . diclofenac (VOLTAREN) 50 MG EC tablet    Sig: Take 1 tablet (50 mg total) by mouth 2 (two) times daily as needed.    Dispense:  14 tablet    Refill:  0  . diphenhydrAMINE (BENADRYL) 25 MG tablet    Sig: Take 1 tablet (25 mg total) by mouth at bedtime as needed.    Dispense:  3 tablet    Refill:  0   Activity as tolerated. Will f/u if not showing improvement over the next week. Reviewed expectations re: course of current medical issues. Questions answered. Outlined signs and symptoms indicating need for more acute intervention. Patient verbalized understanding. After Visit Summary given.   SUBJECTIVE:  [name] [name] is a 46 y.o. male who presents with complaint of R sided neck discomfort. Noticed after playing soccer, the next morning. Continued stiffness. Pain with turning head to the R. No trauma. No h/o similar. Ibuprofen  without much relief. No extremity sensation changes or weakness. No recent illnesses. No headaches.  ROS: As per HPI.   OBJECTIVE:  Vitals:   06/21/17 1123  BP: 133/79  Pulse: 77  Resp: 20  Temp: 98.3 F (36.8 C)  TempSrc: Oral  SpO2: 100%    General appearance: alert; no distress Eyes: PERRLA; EOMI; conjunctiva normal HENT: normocephalic; atraumatic; TMs normal; nasal mucosa normal; oral mucosa normal Neck: pain reported over lateral neck musculature when turning head to the right; muscles tight Lungs: clear to auscultation bilaterally Extremities: no cyanosis or edema; symmetrical with no gross deformities Skin: warm and dry Psychological: alert and cooperative; normal mood and affect No Known Allergies   Social History   Social History  . Marital status: Married    Spouse name: N/A  . Number of children: N/A  . Years of education: N/A   Occupational History  . Not on  file.   Social History Main Topics  . Smoking status: Never Smoker  . Smokeless tobacco: Not on file  . Alcohol use No  . Drug use: No  . Sexual activity: Not on file   Other Topics Concern  . Not on file   Social History Narrative  . No narrative on file   Family History  Problem Relation Age of Onset  . Healthy Mother    Past Surgical History:  Procedure Laterality Date  . HAND SURGERY Right      Mardella Layman, MD 06/21/17 [phone]
